# Patient Record
Sex: Male | Born: 1965 | Race: Black or African American | Hispanic: No | Marital: Married | Smoking: Never smoker
Health system: Southern US, Community
[De-identification: ages and names within clinical notes are randomized; demographics above are authoritative.]

## PROBLEM LIST (undated history)

## (undated) DIAGNOSIS — Z9189 Other specified personal risk factors, not elsewhere classified: Secondary | ICD-10-CM

## (undated) DIAGNOSIS — R030 Elevated blood-pressure reading, without diagnosis of hypertension: Secondary | ICD-10-CM

## (undated) DIAGNOSIS — E785 Hyperlipidemia, unspecified: Secondary | ICD-10-CM

## (undated) DIAGNOSIS — Z9889 Other specified postprocedural states: Secondary | ICD-10-CM

## (undated) HISTORY — PX: ROTATOR CUFF REPAIR: SHX139

## (undated) HISTORY — DX: Other specified postprocedural states: Z98.89

## (undated) HISTORY — PX: OTHER SURGICAL HISTORY: SHX169

## (undated) HISTORY — DX: Other specified personal risk factors, not elsewhere classified: Z91.89

## (undated) HISTORY — DX: Elevated blood-pressure reading, without diagnosis of hypertension: R03.0

## (undated) HISTORY — DX: Hyperlipidemia, unspecified: E78.5

---

## 2006-07-09 ENCOUNTER — Ambulatory Visit: Payer: Self-pay | Admitting: Internal Medicine

## 2006-07-09 LAB — CONVERTED CEMR LAB
Albumin: 3.9 g/dL (ref 3.5–5.2)
Alkaline Phosphatase: 54 units/L (ref 39–117)
CO2: 31 meq/L (ref 19–32)
Glucose, Bld: 111 mg/dL — ABNORMAL HIGH (ref 70–99)
Total Bilirubin: 1.1 mg/dL (ref 0.3–1.2)
Total CHOL/HDL Ratio: 6.5
Total Protein: 7.3 g/dL (ref 6.0–8.3)
Triglycerides: 231 mg/dL (ref 0–149)

## 2006-07-25 ENCOUNTER — Ambulatory Visit: Payer: Self-pay | Admitting: Internal Medicine

## 2006-12-06 ENCOUNTER — Emergency Department (HOSPITAL_COMMUNITY): Admission: EM | Admit: 2006-12-06 | Discharge: 2006-12-06 | Payer: Self-pay | Admitting: Emergency Medicine

## 2007-01-01 ENCOUNTER — Emergency Department (HOSPITAL_COMMUNITY): Admission: EM | Admit: 2007-01-01 | Discharge: 2007-01-01 | Payer: Self-pay | Admitting: Emergency Medicine

## 2007-04-27 ENCOUNTER — Encounter: Payer: Self-pay | Admitting: *Deleted

## 2007-04-27 DIAGNOSIS — Z9889 Other specified postprocedural states: Secondary | ICD-10-CM

## 2007-04-27 DIAGNOSIS — Z9189 Other specified personal risk factors, not elsewhere classified: Secondary | ICD-10-CM

## 2007-04-27 DIAGNOSIS — E785 Hyperlipidemia, unspecified: Secondary | ICD-10-CM

## 2007-04-27 HISTORY — DX: Hyperlipidemia, unspecified: E78.5

## 2007-04-27 HISTORY — DX: Other specified postprocedural states: Z98.89

## 2007-04-27 HISTORY — DX: Other specified personal risk factors, not elsewhere classified: Z91.89

## 2007-10-04 ENCOUNTER — Emergency Department (HOSPITAL_COMMUNITY): Admission: EM | Admit: 2007-10-04 | Discharge: 2007-10-04 | Payer: Self-pay | Admitting: Family Medicine

## 2008-01-07 ENCOUNTER — Ambulatory Visit: Payer: Self-pay | Admitting: Internal Medicine

## 2008-01-07 DIAGNOSIS — I10 Essential (primary) hypertension: Secondary | ICD-10-CM

## 2008-01-07 DIAGNOSIS — R030 Elevated blood-pressure reading, without diagnosis of hypertension: Secondary | ICD-10-CM

## 2008-01-07 HISTORY — DX: Elevated blood-pressure reading, without diagnosis of hypertension: R03.0

## 2010-08-12 NOTE — Assessment & Plan Note (Signed)
[facility]                           PRIMARY CARE OFFICE NOTE   NAME:HARRISPromise, Bushong                      MRN:          [id number]  DATE:07/09/2006                            DOB:          [birth year]    Mr. Werber is a 45 year old African-American gentleman who presents to  establish for ongoing continuity of care.  He has no active complaints  at this time, and reports he is feeling well and doing well.   PAST MEDICAL HISTORY:   SURGICAL:  1. Arthroscopic repair of a right rotator cuff tear in 1987.  2. Arthroscopic repair of a torn ACL on the left in 1996.   MEDICAL ILLNESSES:  The patient had chicken pox as a child, and  otherwise fully immunized.  He has no other major medical illness and  has been healthy.   CURRENT MEDICATIONS:  None.   No drug allergies.   HABITS:  Tobacco, none.  Alcohol, none.  No recreational drug use.   FAMILY HISTORY:  Father had hyperlipidemia and hypertension.  Mother has  diabetes, on oral medication.  No family history for colon cancer, lung  cancer, prostate cancer or heart disease.   SOCIAL HISTORY:  The patient completed several years of college.  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  Patient was married for 5 years, divorced, has been married 6 months to  his current wife, but he has been in a relationship with her for several  years.  Patient has 2 daughters, age 29, in college, and a daughter, 66  years old, who lives with her mother.  Patient was in the [employer] for 6  years and mustered out E-5.  Reports his marriage is in good health.  He  does have a stepson who I will be seeing later today.   REVIEW OF SYSTEMS:  The patient has had no constitutional problems  except for mild weight gain.  No ophthalmologic, ENT, cardiovascular,  respiratory, GI, or GU problems, except he does a report a recent  episode of significant nocturia which was self limited and resolved  in  several days.  No musculoskeletal, dermatologic, neurologic, or  psychiatric issues.   PHYSICAL EXAMINATION:  Temperature was 97.2, blood pressure 156/94,  pulse 72, weight 232.  GENERAL APPEARANCE:  This is a very muscular-appearing gentleman in no  acute distress.  HEENT EXAM:  Normocephalic, atraumatic.  EACs and TMs were normal.  Oropharynx with native dentition in fair repair, although he is missing  several teeth.  No gingivitis was noted.  Posterior pharynx was clear.  Conjunctivae and sclerae were clear.  PERRLA, EOMI.  Funduscopic exam  was unremarkable.  NECK:  Supple without thyromegaly.  NODES:  No lymphadenopathy was noted in the cervical, supraclavicular  regions.  CHEST:  No CVA tenderness.  LUNGS:  Clear with no rales, wheezes, or rhonchi.  CARDIOVASCULAR:  2+ radial pulses, no JVD or carotid bruits.  He had a  quiet precordium with a regular rate and rhythm without murmurs, rubs,  or gallops.  ABDOMEN:  Soft, no guarding,  no rebound.  No organosplenomegaly was  appreciated.  GENITALIA:  Bilaterally descended testicles without masses, normal  phallus.  RECTAL EXAM:  Normal sphincter tone was noted.  Prostate is smooth and  round, normal in size and contour without nodules.  EXTREMITIES:  Without clubbing, cyanosis or edema or deformity.  NEUROLOGIC EXAM:  Nonfocal.   LABORATORY DATA:  Chemistries were normal with a blood sugar of 111,  kidney function was normal with a creatinine of 1.0 and GFR of 105  milliliters per minute.  Liver functions were normal.  Lipid profile  revealed a total cholesterol of 271, triglycerides 231, HDL was 41.9,  LDL cholesterol was 194.4.   ASSESSMENT AND PLAN:  1. Hyperlipidemia.  Patient with markedly elevated LDL cholesterol and      total cholesterol.  He will be notified by phone tree and be asked      to come see me to discuss therapy for this.  He is a candidate for      medication given his markedly elevated LDL  cholesterol.  2. Health maintenance.  Patient's examination is otherwise      unremarkable.  Laboratories are otherwise normal.   In summary, he is a very healthy-appearing gentleman who is newly  diagnosed with hyperlipidemia, will return for discussion of medical  options.     Rosalyn Gess Norins, MD  Electronically Signed    MEN/MedQ  DD: 07/09/2006  DT: 07/10/2006  Job #: [id number]   cc:   Wyn Forster

## 2010-11-11 ENCOUNTER — Ambulatory Visit: Payer: Self-pay | Admitting: Internal Medicine

## 2010-11-16 ENCOUNTER — Ambulatory Visit (INDEPENDENT_AMBULATORY_CARE_PROVIDER_SITE_OTHER)
Admission: RE | Admit: 2010-11-16 | Discharge: 2010-11-16 | Disposition: A | Discharge status: Home / Self Care | Payer: 59 | Source: Ambulatory Visit | Attending: Internal Medicine | Admitting: Internal Medicine

## 2010-11-16 ENCOUNTER — Other Ambulatory Visit (INDEPENDENT_AMBULATORY_CARE_PROVIDER_SITE_OTHER): Payer: 59

## 2010-11-16 ENCOUNTER — Other Ambulatory Visit: Payer: Self-pay | Admitting: Internal Medicine

## 2010-11-16 ENCOUNTER — Ambulatory Visit (INDEPENDENT_AMBULATORY_CARE_PROVIDER_SITE_OTHER): Payer: 59 | Admitting: Internal Medicine

## 2010-11-16 VITALS — BP 140/100 | HR 84 | Temp 98.3°F | Wt 231.0 lb

## 2010-11-16 DIAGNOSIS — R030 Elevated blood-pressure reading, without diagnosis of hypertension: Secondary | ICD-10-CM

## 2010-11-16 DIAGNOSIS — Z0000 Encounter for general adult medical examination without abnormal findings: Secondary | ICD-10-CM

## 2010-11-16 DIAGNOSIS — R7989 Other specified abnormal findings of blood chemistry: Secondary | ICD-10-CM

## 2010-11-16 DIAGNOSIS — E785 Hyperlipidemia, unspecified: Secondary | ICD-10-CM

## 2010-11-16 DIAGNOSIS — Z136 Encounter for screening for cardiovascular disorders: Secondary | ICD-10-CM

## 2010-11-16 LAB — COMPREHENSIVE METABOLIC PANEL
BUN: 11 mg/dL (ref 6–23)
CO2: 30 mEq/L (ref 19–32)
Calcium: 9.6 mg/dL (ref 8.4–10.5)
Chloride: 102 mEq/L (ref 96–112)
Creatinine, Ser: 1.2 mg/dL (ref 0.4–1.5)
GFR: 85.06 mL/min (ref >60.00)
Glucose, Bld: 125 mg/dL — ABNORMAL HIGH (ref 70–99)

## 2010-11-16 LAB — LIPID PANEL
HDL: 43.7 mg/dL (ref >39.00)
Triglycerides: 309 mg/dL — ABNORMAL HIGH (ref 0.0–149.0)

## 2010-11-16 MED ORDER — HYDROCHLOROTHIAZIDE 25 MG PO TABS: 25.0000 mg | ORAL_TABLET | Freq: Every day | ORAL | Status: DC

## 2010-11-17 ENCOUNTER — Telehealth: Payer: Self-pay | Admitting: Internal Medicine

## 2010-11-17 ENCOUNTER — Encounter: Payer: Self-pay | Admitting: Internal Medicine

## 2010-11-17 DIAGNOSIS — Z0000 Encounter for general adult medical examination without abnormal findings: Secondary | ICD-10-CM | POA: Insufficient documentation

## 2010-11-17 MED ORDER — ATORVASTATIN CALCIUM 20 MG PO TABS: 20.0000 mg | ORAL_TABLET | Freq: Every day | ORAL | Status: DC

## 2010-11-17 NOTE — Assessment & Plan Note (Signed)
BP Readings from Last 3 Encounters:  11/16/10 140/100  01/07/08 154/96  07/25/06 150/91   Definite elevation in blood pressure over time. Initial evaluation with lab, cxr and EKG with no signs of end-organ damage.  Plan - diuretic therapy with HCTZ 25 mg daily           Follow-up BP

## 2010-11-17 NOTE — Telephone Encounter (Signed)
Patient informed. 

## 2010-11-17 NOTE — Telephone Encounter (Signed)
pleaswe call patient - LDL 192. Rx for lipitor sent to [facility]. Return for lab in 1 month Thanks

## 2010-11-17 NOTE — Progress Notes (Signed)
Subjective:    Patient ID: Jeremy Velasquez, male    DOB: 11-Jul-1965, 45 y.o.   MRN: [id number]  HPI Mr. Jeremy Velasquez presents for a general medial exam. He was last seen in '09. Labs from that visit reviewed revealing LDL 194. He was also hypertensive at that visit. He chose life-style management and no medications were initiated. He reports that he has had Photographer physical exam by the county and was told his BP was OK and his lipids were a little high but he was not advised to seek treatment.   He has been feeling well. He is very athletic working out on a regular basis. He does admit to some weight gain.          Review of Systems Review of Systems  Constitutional:  Negative for fever, chills, activity change and unexpected weight change.  HEENT:  Negative for hearing loss, ear pain, congestion, neck stiffness and postnasal drip. Negative for sore throat or swallowing problems. Negative for dental complaints.   Eyes: Negative for vision loss or change in visual acuity. Copper wiring noted on fundoscopic exam Respiratory: Negative for chest tightness and wheezing.   Cardiovascular: Negative for chest pain and palpitation. No decreased exercise tolerance Gastrointestinal: No change in bowel habit. No bloating or gas. No reflux or indigestion Genitourinary: Negative for urgency, frequency, flank pain and difficulty urinating.  Musculoskeletal: Negative for myalgias, back pain, arthralgias and gait problem.  Neurological: Negative for dizziness, tremors, weakness and headaches.  Hematological: Negative for adenopathy.  Psychiatric/Behavioral: Negative for behavioral problems and dysphoric mood.       Objective:   Physical Exam Vital signs reviewed - BP elevated Gen'l: Well nourished well developed AA male in no acute distress  HEENT:  Head: Normocephalic and atraumatic.  Right Ear: External ear normal. EAC/TM nl Left Ear: External ear normal.  EAC/TM nl Nose: Nose  normal.  Mouth/Throat: Oropharynx is clear and moist. Dentition - native, in good repair. No buccal or palatal lesions. Posterior pharynx clear. Eyes: Conjunctivae and sclera clear. EOM intact. Pupils are equal, round, and reactive to light. Fundiscopic exam reveals copper wiring. Right eye exhibits no discharge. Left eye exhibits no discharge. Neck: Normal range of motion. Neck supple. No JVD present. No tracheal deviation present. No thyromegaly present.  Cardiovascular: Normal rate, regular rhythm, no gallop, no friction rub, no murmur heard.      Quiet precordium. 2+ radial and DP pulses . No carotid bruits Pulmonary/Chest: Effort normal. No respiratory distress or increased WOB, no wheezes, no rales. No chest wall deformity or CVAT. Abdominal: Soft. Bowel sounds are normal in all quadrants. He exhibits no distension, no tenderness, no rebound or guarding, No heptosplenomegaly  Genitourinary:  deferred Musculoskeletal: Normal range of motion. He exhibits no edema and no tenderness.       Small and large joints without redness, synovial thickening or deformity. Full range of motion preserved about all small, median and large joints.  Lymphadenopathy:    He has no cervical or supraclavicular adenopathy.  Neurological: He is alert and oriented to person, place, and time. CN II-XII intact. DTRs 2+ and symmetrical biceps, radial and patellar tendons. Cerebellar function normal with no tremor, rigidity, normal gait and station.  Skin: Skin is warm and dry. No rash noted. No erythema.  Psychiatric: He has a normal mood and affect. His behavior is normal. Thought content normal.   Lab Results  Component Value Date   CHOL 286* 11/16/2010   TRIG 309.0*  11/16/2010   HDL 43.70 11/16/2010   LDLDIRECT 192.3 11/16/2010   ALT 30 11/16/2010   AST 24 11/16/2010   NA 138 11/16/2010   K 4.1 11/16/2010   CL 102 11/16/2010   CREATININE 1.2 11/16/2010   BUN 11 11/16/2010   CO2 30 11/16/2010       Glucose                  125 (non-fasting)                                             11/16/2010  Clinical Data: Hypertension  CHEST - 2 VIEW  Comparison: None  Findings: Heart size and mediastinal contours appear normal.  No pleural effusion or pulmonary edema identified.  No airspace consolidation identified.  Review of the visualized osseous structures is significant for mild  multilevel thoracic spondylosis.  IMPRESSION:  1. No active cardiopulmonary abnormalities.  Original Report Authenticated By: Rosealee Albee, M.D.  12 lead EKG normal without signs of LVH         Assessment & Plan:

## 2010-11-17 NOTE — Assessment & Plan Note (Signed)
Patinet had an LDL of 194 in '09. Repeat lab today reveals LDL 192. By NCEP ATP III guidelines he needs medical treatment.  Plan - atorvastatin 20 mg daily           Follow-up lab in 4 weeks with recommendations to follow.

## 2010-12-22 LAB — POCT RAPID STREP A: Streptococcus, Group A Screen (Direct): NEGATIVE

## 2012-08-20 ENCOUNTER — Other Ambulatory Visit: Payer: Self-pay | Admitting: Orthopedic Surgery

## 2012-08-20 DIAGNOSIS — M542 Cervicalgia: Secondary | ICD-10-CM

## 2012-08-21 ENCOUNTER — Other Ambulatory Visit: Payer: 59

## 2012-09-28 ENCOUNTER — Ambulatory Visit (INDEPENDENT_AMBULATORY_CARE_PROVIDER_SITE_OTHER): Payer: 59 | Admitting: Family Medicine

## 2012-09-28 VITALS — BP 128/64 | HR 99 | Temp 98.2°F | Resp 16 | Ht 69.0 in | Wt 228.0 lb

## 2012-09-28 DIAGNOSIS — R059 Cough, unspecified: Secondary | ICD-10-CM

## 2012-09-28 DIAGNOSIS — J209 Acute bronchitis, unspecified: Secondary | ICD-10-CM

## 2012-09-28 DIAGNOSIS — J00 Acute nasopharyngitis [common cold]: Secondary | ICD-10-CM

## 2012-09-28 DIAGNOSIS — R05 Cough: Secondary | ICD-10-CM

## 2012-09-28 MED ORDER — AZITHROMYCIN 250 MG PO TABS: ORAL_TABLET | ORAL | Status: DC

## 2012-09-28 MED ORDER — BENZONATATE 100 MG PO CAPS: 200.0000 mg | ORAL_CAPSULE | Freq: Two times a day (BID) | ORAL | Status: DC | PRN

## 2012-09-28 MED ORDER — FLUTICASONE PROPIONATE 50 MCG/ACT NA SUSP: 2.0000 | Freq: Every day | NASAL | Status: DC

## 2012-09-28 NOTE — Progress Notes (Signed)
[facility]:  Office Visit  Chief Complaint:  Chief Complaint  Patient presents with  . Cough    x 1 week     HPI: Jeremy Velasquez is a 48 y.o. male who complains of  1 week history of coughing and of feeling poorly, nonproductive cough. Mostly during the day. [place]. He has  Had subjective fevers, chills.  No SOB/wheezing. Has HA from coughing top of his head. He has tried nyquil, robitussion, mucinex without relief. Nonsmoker. No asthma/allergies. Denies facial pain. Denies ear pain.   Past Medical History  Diagnosis Date  . CHICKENPOX, HX OF 04/27/2007  . ELEVATED BLOOD PRESSURE WITHOUT DIAGNOSIS OF HYPERTENSION 01/07/2008  . HYPERLIPIDEMIA 04/27/2007  . ROTATOR CUFF REPAIR, RIGHT, HX OF 04/27/2007   Past Surgical History  Procedure Laterality Date  . Arthroscopy knee surgery      Left  . Rotator cuff repair      right   History   Social History  . Marital Status: Married    Spouse Name: N/A    Number of Children: N/A  . Years of Education: N/A   Social History Main Topics  . Smoking status: Never Smoker   . Smokeless tobacco: None  . Alcohol Use: No  . Drug Use: No  . Sexually Active:    Other Topics Concern  . None   Social History Narrative   HSG, Married "59- 6 months, divorced; married '95-5 years, divorced 07   2 daughters-'88, '86; 1 son '85; 2 step sons   [employer] 6 years, mustered out E-5   Work: Camera operator   Family History  Problem Relation Age of Onset  . Diabetes Mother   . Hyperlipidemia Father   . Hypertension Father    No Known Allergies Prior to Admission medications   Medication Sig Start Date End Date Taking? Authorizing Provider  Multiple Vitamin ([facility] DAILY MULTIPLE PO) Take 1 capsule by mouth.     Yes Historical Provider, MD  atorvastatin (LIPITOR) 20 MG tablet Take 1 tablet (20 mg total) by mouth daily. 11/17/10 11/17/11  Jacques Navy, MD  hydrochlorothiazide 25 MG  tablet Take 1 tablet (25 mg total) by mouth daily. 11/16/10 11/16/11  Jacques Navy, MD     ROS: The patient denies night sweats, unintentional weight loss, chest pain, palpitations, wheezing, dyspnea on exertion, nausea, vomiting, abdominal pain, dysuria, hematuria, melena, numbness, or tingling.   All other systems have been reviewed and were otherwise negative with the exception of those mentioned in the HPI and as above.    PHYSICAL EXAM: Filed Vitals:   09/28/12 1310  BP: 128/64  Pulse: 99  Temp: 98.2 F (36.8 C)  Resp: 16   Filed Vitals:   09/28/12 1310  Height: 5\' 9"  (1.753 m)  Weight: 228 lb (103.42 kg)   Body mass index is 33.65 kg/(m^2).  General: Alert, no acute distress HEENT:  Normocephalic, atraumatic, oropharynx patent. Tm nl. EOMI PERRLA, no exudates, + boggy erythematous nares. No sinus tenderness Cardiovascular:  Regular rate and rhythm, no rubs murmurs or gallops.  No Carotid bruits, radial pulse intact. No pedal edema.  Respiratory: Clear to auscultation bilaterally.  No wheezes, rales, or rhonchi.  No cyanosis, no use of accessory musculature GI: No organomegaly, abdomen is soft and non-tender, positive bowel sounds.  No masses. Skin: No rashes. Neurologic: Facial musculature symmetric. Psychiatric: Patient is appropriate throughout our interaction. Lymphatic: No cervical lymphadenopathy  Musculoskeletal: Gait intact.   LABS: Results for orders placed in visit on 11/16/10  LDL CHOLESTEROL, DIRECT      Result Value Range   Direct LDL 192.3       EKG/XRAY:   Primary read interpreted by Dr. Conley Rolls at [facility].   ASSESSMENT/PLAN: Encounter Diagnoses  Name Primary?  . Cough Yes  . Acute bronchitis   . Acute rhinitis     Rx Flonase, tessalon perles Rx Z pack  OTC antihistaime if no improvement with sxs treatment then can take z pack F/u prn    Tazia Illescas PHUONG, DO 09/28/2012 1:58 PM

## 2012-09-28 NOTE — Patient Instructions (Signed)

## 2013-03-03 ENCOUNTER — Ambulatory Visit (INDEPENDENT_AMBULATORY_CARE_PROVIDER_SITE_OTHER): Payer: 59 | Admitting: Internal Medicine

## 2013-03-03 ENCOUNTER — Other Ambulatory Visit (INDEPENDENT_AMBULATORY_CARE_PROVIDER_SITE_OTHER): Payer: 59

## 2013-03-03 ENCOUNTER — Encounter: Payer: Self-pay | Admitting: Internal Medicine

## 2013-03-03 VITALS — BP 168/118 | HR 75 | Temp 98.3°F | Ht 69.0 in | Wt 236.0 lb

## 2013-03-03 DIAGNOSIS — Z0000 Encounter for general adult medical examination without abnormal findings: Secondary | ICD-10-CM

## 2013-03-03 DIAGNOSIS — R739 Hyperglycemia, unspecified: Secondary | ICD-10-CM

## 2013-03-03 DIAGNOSIS — R7309 Other abnormal glucose: Secondary | ICD-10-CM

## 2013-03-03 DIAGNOSIS — E119 Type 2 diabetes mellitus without complications: Secondary | ICD-10-CM | POA: Insufficient documentation

## 2013-03-03 DIAGNOSIS — E785 Hyperlipidemia, unspecified: Secondary | ICD-10-CM

## 2013-03-03 DIAGNOSIS — Z23 Encounter for immunization: Secondary | ICD-10-CM

## 2013-03-03 DIAGNOSIS — R030 Elevated blood-pressure reading, without diagnosis of hypertension: Secondary | ICD-10-CM

## 2013-03-03 LAB — COMPREHENSIVE METABOLIC PANEL
ALT: 35 U/L (ref 0–53)
Albumin: 4.2 g/dL (ref 3.5–5.2)
CO2: 29 mEq/L (ref 19–32)
Calcium: 9.6 mg/dL (ref 8.4–10.5)
Chloride: 103 mEq/L (ref 96–112)
GFR: 83.42 mL/min (ref >60.00)
Potassium: 4.2 mEq/L (ref 3.5–5.1)
Sodium: 140 mEq/L (ref 135–145)
Total Protein: 8 g/dL (ref 6.0–8.3)

## 2013-03-03 LAB — LDL CHOLESTEROL, DIRECT: Direct LDL: 209.1 mg/dL

## 2013-03-03 MED ORDER — LOSARTAN POTASSIUM 50 MG PO TABS: 50.0000 mg | ORAL_TABLET | Freq: Every day | ORAL | Status: AC

## 2013-03-03 MED ORDER — ATORVASTATIN CALCIUM 20 MG PO TABS: 20.0000 mg | ORAL_TABLET | Freq: Every day | ORAL | Status: DC

## 2013-03-03 NOTE — Progress Notes (Signed)
Pre visit review using our clinic review tool, if applicable. No additional management support is needed unless otherwise documented below in the visit note. 

## 2013-03-03 NOTE — Progress Notes (Signed)
Subjective:     Patient ID: Jeremy Velasquez, male   DOB: 1965-10-30, 47 y.o.   MRN: [id number]  HPI Jeremy Velasquez is a 47 yo male with PMH of hyperlipidemia and who presents for annual physical. He denies any new complaints at this time. He is feeling well, although he is concerned about his blood pressure. His bp today in the office was 168/118. He checked his blood pressure two weeks ago at [facility] and it was 140/90. He admits he feels somewhat anxious about coming to the doctor.  Interval history: Jeremy Velasquez received glasses in May of 2013 from optometrist.  He had a pinched nerve in his neck diagnosed in May 2014 by Dr. Venetia Maxon (neurosurgeon), which lead to tingling in R arm. He feels some discomfort in his R shoulder. He has had limited exercise due to his arm. He has not been able to ride his bike like he would like to. In January, he will able to get back to his normal activities.   He does not eat fast food regularly. He cooks regularly for himself. He eats vegetables and canned fruit. He avoids eating fresh fruit due to concern of pesticides. He recently started working at the 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 He says he doesn't feel stressed; although, believes he admits his jobs are stressful.   He never picked up his lipitor two years (2012) ago for high cholesterol that was prescribed by Dr. Debby Bud. At this time, he only takes a multivitamin.   Past Medical History  Diagnosis Date  . CHICKENPOX, HX OF 04/27/2007  . ELEVATED BLOOD PRESSURE WITHOUT DIAGNOSIS OF HYPERTENSION 01/07/2008  . HYPERLIPIDEMIA 04/27/2007  . ROTATOR CUFF REPAIR, RIGHT, HX OF 04/27/2007   Past Surgical History  Procedure Laterality Date  . Arthroscopy knee surgery      Left  . Rotator cuff repair      right   Family History  Problem Relation Age of Onset  . Diabetes Mother   . Hyperlipidemia Father   . Hypertension Father    History   Social History  . Marital  Status: Married    Spouse Name: N/A    Number of Children: N/A  . Years of Education: N/A   Occupational History  . Not on file.   Social History Main Topics  . Smoking status: Never Smoker   . Smokeless tobacco: Not on file  . Alcohol Use: No  . Drug Use: No  . Sexual Activity:    Other Topics Concern  . 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 Multiple Vitamin ([facility] DAILY MULTIPLE PO) Take 1 capsule by mouth.         No current facility-administered medications on file prior to visit.      Review of Systems System review is negative for any constitutional, cardiac, pulmonary, GI or neuro symptoms or complaints other than as described in the HPI.      Objective:   Physical Exam Gen: Well-appearing man in no acute distress HEENT: Antiicteric sclerae. PERRL. Normal tympanic membranes. Nares patent. Non-erythematous oropharynx. No cervical lymphadenopathy. No thyromegaly.  CV:  RRR. No murmurs, rubs or gallops. 2+ radial and posterior tibial pulses bilaterally. PMI nondisplaced.  Abd: Soft, non-tender to palpation, and non-distended. No hepatomegaly or splenomegaly. Bowel sounds present.  Nuero: AOx3. EOM intact without nystagmus. Facial strength and sensation intact bilaterally. Normal shoulder shrug strength. 5/5 grip strength bilaterally. 5/5 lower extremity strength bilaterally. Intact sensation to light touch and pinprick in upper and lower extremities.   Recent Results (from the past 2160 hour(s))  COMPREHENSIVE METABOLIC PANEL     Status: Abnormal   Collection Time    03/03/13 11:47 AM      Result Value Range   Sodium 140  135 - 145 mEq/L   Potassium 4.2  3.5 - 5.1 mEq/L    Chloride 103  96 - 112 mEq/L   CO2 29  19 - 32 mEq/L   Glucose, Bld 115 (*) 70 - 99 mg/dL   BUN 13  6 - 23 mg/dL   Creatinine, Ser 1.2  0.4 - 1.5 mg/dL   Total Bilirubin 1.1  0.3 - 1.2 mg/dL   Alkaline Phosphatase 59  39 - 117 U/L   AST 22  0 - 37 U/L   ALT 35  0 - 53 U/L   Total Protein 8.0  6.0 - 8.3 g/dL   Albumin 4.2  3.5 - 5.2 g/dL   Calcium 9.6  8.4 - 16.1 mg/dL   GFR 09.60  >45.40 mL/min  LIPID PANEL     Status: Abnormal   Collection Time    03/03/13 11:47 AM      Result Value Range   Cholesterol 288 (*) 0 - 200 mg/dL   Comment: ATP III Classification       Desirable:  < 200 mg/dL               Borderline High:  200 - 239 mg/dL          High:  > = 981 mg/dL   Triglycerides 191.4 (*) 0.0 - 149.0 mg/dL   Comment: Normal:  <782 mg/dLBorderline High:  150 - 199 mg/dL   HDL 95.62 (*) >13.08 mg/dL   VLDL 65.7 (*) 0.0 - 84.6 mg/dL   Total CHOL/HDL Ratio 8     Comment:                Men          Women1/2 Average Risk     3.4          3.3Average Risk          5.0          4.42X Average Risk          9.6          7.13X Average Risk          15.0          11.0                      HEMOGLOBIN A1C     Status: Abnormal   Collection Time    03/03/13 11:47 AM      Result Value Range   Hemoglobin A1C 7.1 (*) 4.6 - 6.5 %   Comment: Glycemic Control Guidelines for People with Diabetes:Non Diabetic:  <6%Goal of Therapy: <7%Additional Action Suggested:  >8%   LDL CHOLESTEROL, DIRECT     Status: None   Collection Time    03/03/13 11:47 AM      Result Value Range   Direct LDL  209.1     Comment: Optimal:  <100 mg/dLNear or Above Optimal:  100-129 mg/dLBorderline High:  130-159 mg/dLHigh:  160-189 mg/dLVery High:  >190 mg/dL       Assessment and Plan:     Jeremy Velasquez is a 47 yo male with hypercholesterolemia who present today for an annual physical.   1) Order CBC and lipid panel to check cholesterol, lipid levels and renal function. Order A1c to check glucose since past labs show  borderline glucose levels.  - Consider starting Lipitor (atorvastatin) if cholesterol is elevated. If start the drug, follow up in one month to check liver and cholesterol panel.  2) Blood pressure: His blood pressure was elevated (168/118) in the office today. He was started on losartan today.   3) He received a flu shot today.   4) Joint Pain: Advised him about exercises and glucosamine to help with pain in his joints.

## 2013-03-03 NOTE — Patient Instructions (Signed)
1) Cholesterol: We took labs today to check your cholesterol. Your cholesterol in the past (2012) was elevated.  If your cholesterol is elevated on your current labs, we will probably start a medication (lipitor) to lower your cholesterol. If you are started on lipitor, we will want to check your cholesterol again in 1 month after initiation of the drug.   2) Blood sugar: We took a lab today to check your blood sugar. We will let you know the results of these tests.   3) Blood pressure: Your blood pressure was elevated at the office today (168/118). You were started on losartan (angiotensin receptor blocker) to help with your blood pressure. Please try to check your blood pressure at home or at the fire station.  4) Joint Pain: Range of motion exercises will help to improve your joint pain. Additionally, weight loss will help your joints. You can take Tylenol as needed for joint pain. You can consider starting glucosamine to help with your joints. You will need to take glucosamine for 6 - 8 weeks to see if you have any improvement. Unfortunately, we can't comment on the quality of the vitamins you are taken.   5) Please send [place] the records from Dr. Venetia Maxon (MRI results and notes) and your vaccination records (Tdap) so we can be aware of them.

## 2013-03-04 ENCOUNTER — Telehealth: Payer: Self-pay

## 2013-03-04 NOTE — Telephone Encounter (Signed)
Message copied by Noreene Larsson on Tue Mar 04, 2013  8:26 AM ------      Message from: Jacques Navy      Created: Tue Mar 04, 2013  4:03 AM       Please call: come to lab Jan 12th. Needs office visit 2 days later.      Thanks ------

## 2013-03-04 NOTE — Assessment & Plan Note (Signed)
INterval history - no major illness, injury or surgery but untreated hypertension and hyperlipidemia. Physical exam is normal. Lab reviewed: abnormal serum glucose, A1C, lipids. He is current with immunizations by his report.  In summary   A nice man who is now clearly informed of his medical problems. He will return in 1 month for lab and f/u OV.

## 2013-03-04 NOTE — Telephone Encounter (Signed)
Pt is aware.  Scheduled for Monday morning.

## 2013-03-04 NOTE — Assessment & Plan Note (Signed)
H/o elevated serum glucose. A1C today at 7.1%  Plan Patient to return in one month for follow up. Will review importance of lifestyle management. May need referral to DM educator/dietician.

## 2013-03-04 NOTE — Telephone Encounter (Signed)
Left message on mobile phone that he needs to come to lab on Jan 12 and then have an appt on Jan 14. I asked him when he gets this message to call the office.  Harriett Sine will you put patient on the schedule for Jan 14 just so he's on the schedule, thanks

## 2013-03-04 NOTE — Assessment & Plan Note (Signed)
BP Readings from Last 3 Encounters:  03/03/13 168/118  09/28/12 128/64  11/16/10 140/100   Patient with history c/w hypertension - untreated.  Plan Start ARB  ROV BP check and lab in 1 month.

## 2013-03-04 NOTE — Assessment & Plan Note (Signed)
Patient does not recall in '12 from the office about his LDL being 192 and the need to start medication. This was reviewed today. Repeat lab with LDL 206.  Plan Start lipitor 20 mg  Follow up lab and ov in 1 month.

## 2013-03-10 ENCOUNTER — Encounter: Payer: Self-pay | Admitting: Internal Medicine

## 2013-03-10 ENCOUNTER — Ambulatory Visit (INDEPENDENT_AMBULATORY_CARE_PROVIDER_SITE_OTHER): Payer: 59 | Admitting: Internal Medicine

## 2013-03-10 VITALS — BP 158/100 | HR 87 | Temp 97.2°F | Wt 237.8 lb

## 2013-03-10 DIAGNOSIS — E119 Type 2 diabetes mellitus without complications: Secondary | ICD-10-CM

## 2013-03-10 DIAGNOSIS — I10 Essential (primary) hypertension: Secondary | ICD-10-CM

## 2013-03-10 DIAGNOSIS — E785 Hyperlipidemia, unspecified: Secondary | ICD-10-CM

## 2013-03-10 NOTE — Assessment & Plan Note (Signed)
Jeremy Velasquez' last A1c was 7.1 confirming the diagnosis of diabetes.   Plan Advised patient to continue with his low carb, low fat diet and with his exercise program.   Patient did not desire to meet with a diabetic educator at this time, but told him he can meet with one whenever he desires.   Recheck A1c in 3 months to see how effective his lifestyle interventions are.

## 2013-03-10 NOTE — Assessment & Plan Note (Signed)
Blood pressure was elevated again today (158/100). His BP last week was 168/118. Patient does not want to start losartan. Advised patient on the benefits of losartan: reduced risk of heart attacks, stroke and protection from diabetic damage to the kidneys. Patient is willing to start hydrochlorothiazide.   Plan  Asked patient to send message to Dr. Debby Bud regarding what medication he wishes to start.

## 2013-03-10 NOTE — Progress Notes (Signed)
Subjective:     Patient ID: Jeremy Velasquez, male   DOB: [birth year], 47 y.o.   MRN: [id number]  HPI Jeremy Velasquez is a 47 yo male with PMH of hyperlipidemia and recently diagnosed diabetes, who presents for follow up on his lab work and diagnosis of diabetes.   He started working out today. He plans to work out 4 -5 times a week doing weightlifting and Education administrator. He was waiting for his L shoulder to heal, but decided to go ahead and begin exercising. He is being careful about protecting his shoulder. He will be cleared to be normal duty with regards to his shoulder in January.  He has changed his diet. He cut out all fast food and fried food. He is eating more vegetables, fruits and nuts. He does not drink soda or sweet tea. He drinks water and juice. He drinks one glass of sunny-d a day. He is regularly drinking water. He has gained about 25 lbs over the past year.   He is familiar with diabetes because his mother in law has diabetes. He does not care to see a diabetic counselor at this time. He wants to make several lifestyle changes (which he has already initiated) to help control his diabetes and lipids.  He is not currently taking the losartan and Lipitor. He does not want to start taking the losartan. He would prefer to start taking hydrochlorothiazide. He is not ready to start taking Lipitor. He wants to avoid taking medications if at all possible.   PMH, FamHx and SocHx reviewed for any changes and relevance. Current Outpatient Prescriptions on File Prior to Visit  Medication Sig Dispense Refill  . atorvastatin (LIPITOR) 20 MG tablet Take 1 tablet (20 mg total) by mouth daily.  30 tablet  11  . losartan (COZAAR) 50 MG tablet Take 1 tablet (50 mg total) by mouth daily.  30 tablet  11  . Multiple Vitamin ([facility] DAILY MULTIPLE PO) Take 1 capsule by mouth.         No current facility-administered medications on file prior to visit.     Review of Systems Negative except as mentioned  above.      Objective:   Physical Exam Gen: Well-appearing man in no acute distress.  HEENT: Antiicteric Sclerae. PEERL. No cervical lymphadenopathy. CV:  RRR. No murmurs, rubs or gallops. 2+ Radial pulses. Abd: Soft, non-tender and non-distended.  Ext: No edema.     Assessment and Plan:    Jeremy Velasquez is a 47 yo male with PMH of hyperlipidemia and recently diagnosed diabetes, who presents for follow up on his lab work and diagnosis of diabetes.   1) Diabetes: Jeremy Velasquez' last A1c was 7.1 confirming the diagnosis of diabetes.  Advised patient to continue with his low carb, low fat diet and with his exercise program. Patient did not desire to meet with a diabetic educator at this time, but told him he can meet with one whenever he desires.  Recheck A1c in 3 months to see how effective his lifestyle interventions are.   2) HTN: Blood pressure was elevated again today (158/100). His BP last week was 168/118. Patient does not want to start losartan. Advised patient on the benefits of losartan: reduced risk of heart attacks, stroke and protection from diabetic damage to the kidneys. Patient is willing to start hydrochlorothiazide.  Asked patient to send message to Dr. Debby Bud regarding what medication he wishes to start.   3) Hypercholesteremia: His LDL cholesterol was 292.  Advised patient regarding the risks of high cholesterol. Patient has not been taking his statin and does not wish to start one at this time. Advised patient to continue with his diet and exercise.  Recheck BMP, chemistries and lipids in 3 months. Most likely will start a statin at that time if patient is willing to take a medication.

## 2013-03-10 NOTE — Progress Notes (Signed)
Pre visit review using our clinic review tool, if applicable. No additional management support is needed unless otherwise documented below in the visit note. 

## 2013-03-10 NOTE — Patient Instructions (Signed)
Pleasure seeing you again today Jeremy Velasquez! You are making awesome lifestyle changes.   1) Diabetes: You are making great strides in changing your diet and exercising regularly. Continue with these changes. Please continue to eat a low fat, low carb diet. If you stick with these changes, your blood sugars will likely remain well controlled.  If you desire to see a diabetic counselor/educator please let us know. We will recheck your hemoglobin A1c (long rang sugars) in 3 months to see if gets below 7. Your hemoglobin A1c was 7.1 last week.  2) Cholesterol: Your LDL cholesterol was 209 last week. We would like your LDL cholesterol less than 161. Continue with your diet changes. Changing your diet is the best way to lower your cholesterol. We can recheck your cholesterol in 3 months. We will reassess starting a statin at that time. Most likely, you will need to start a statin at that time.   3) Blood pressure: Your blood pressure today was 158/100, which was elevated. Losartan (angiotensin receptor blood pressure) lowers your blood pressure and protects your kidneys from damage that can occur to the kidneys from diabetes. Losartan reduces your risk of heart attacks and strokes. If your blood pressure becomes well controlled in the future, we can stop this medicaiton.   You can find web-sites online to learn about medications and their side effects. Send Dr. Debby Bud on [place] regarding your blood pressure. If you do not desire to start losartan, we can start hydrochlorthiazide; however, losartan would be a better choice for you.

## 2013-03-10 NOTE — Assessment & Plan Note (Signed)
LDL cholesterol was 202. Advised patient regarding the risks of high cholesterol. Patient has not been taking his statin and does not wish to start one at this time.   Plan Advised patient to continue with his diet and exercise.   Recheck BMP, chemistries and lipids in 3 months. Most likely will start a statin at that time if patient is willing to take a medication.

## 2013-05-16 ENCOUNTER — Emergency Department (HOSPITAL_COMMUNITY)
Admission: EM | Admit: 2013-05-16 | Discharge: 2013-05-16 | Disposition: A | Discharge status: Home / Self Care | Payer: Worker's Compensation | Attending: Emergency Medicine | Admitting: Emergency Medicine

## 2013-05-16 ENCOUNTER — Encounter (HOSPITAL_COMMUNITY): Payer: Self-pay | Admitting: Emergency Medicine

## 2013-05-16 ENCOUNTER — Emergency Department (HOSPITAL_COMMUNITY): Payer: Worker's Compensation

## 2013-05-16 DIAGNOSIS — Y990 Civilian activity done for income or pay: Secondary | ICD-10-CM | POA: Insufficient documentation

## 2013-05-16 DIAGNOSIS — W1809XA Striking against other object with subsequent fall, initial encounter: Secondary | ICD-10-CM | POA: Insufficient documentation

## 2013-05-16 DIAGNOSIS — Y929 Unspecified place or not applicable: Secondary | ICD-10-CM | POA: Insufficient documentation

## 2013-05-16 DIAGNOSIS — Z8619 Personal history of other infectious and parasitic diseases: Secondary | ICD-10-CM | POA: Insufficient documentation

## 2013-05-16 DIAGNOSIS — S63501A Unspecified sprain of right wrist, initial encounter: Secondary | ICD-10-CM

## 2013-05-16 DIAGNOSIS — Z862 Personal history of diseases of the blood and blood-forming organs and certain disorders involving the immune mechanism: Secondary | ICD-10-CM | POA: Insufficient documentation

## 2013-05-16 DIAGNOSIS — Y939 Activity, unspecified: Secondary | ICD-10-CM | POA: Insufficient documentation

## 2013-05-16 DIAGNOSIS — Z79899 Other long term (current) drug therapy: Secondary | ICD-10-CM | POA: Insufficient documentation

## 2013-05-16 DIAGNOSIS — S8010XA Contusion of unspecified lower leg, initial encounter: Secondary | ICD-10-CM | POA: Insufficient documentation

## 2013-05-16 DIAGNOSIS — S63509A Unspecified sprain of unspecified wrist, initial encounter: Secondary | ICD-10-CM | POA: Insufficient documentation

## 2013-05-16 DIAGNOSIS — Z8639 Personal history of other endocrine, nutritional and metabolic disease: Secondary | ICD-10-CM | POA: Insufficient documentation

## 2013-05-16 DIAGNOSIS — S8011XA Contusion of right lower leg, initial encounter: Secondary | ICD-10-CM

## 2013-05-16 MED ORDER — IBUPROFEN 600 MG PO TABS: 600.0000 mg | ORAL_TABLET | Freq: Four times a day (QID) | ORAL | Status: AC | PRN

## 2013-05-16 MED ORDER — TRAMADOL HCL 50 MG PO TABS: 50.0000 mg | ORAL_TABLET | Freq: Four times a day (QID) | ORAL | Status: AC | PRN

## 2013-05-16 NOTE — ED Notes (Signed)
Pt. slipped and fell on ice this evening while on duty [facility]( [place] [employer]) this evening . No LOC / ambulatory , alert and oriented /respirations unlabored . Pt. reports right wrist pain and right shin discomfort .

## 2013-05-16 NOTE — Discharge Instructions (Signed)
Contusion °A contusion is a deep bruise. Contusions are the result of an injury that caused bleeding under the skin. The contusion may turn blue, purple, or yellow. Minor injuries will give you a painless contusion, but more severe contusions may stay painful and swollen for a few weeks.  °CAUSES  °A contusion is usually caused by a blow, trauma, or direct force to an area of the body. °SYMPTOMS  °· Swelling and redness of the injured area. °· Bruising of the injured area. °· Tenderness and soreness of the injured area. °· Pain. °DIAGNOSIS  °The diagnosis can be made by taking a history and physical exam. An X-ray, CT scan, or MRI may be needed to determine if there were any associated injuries, such as fractures. °TREATMENT  °Specific treatment will depend on what area of the body was injured. In general, the best treatment for a contusion is resting, icing, elevating, and applying cold compresses to the injured area. Over-the-counter medicines may also be recommended for pain control. Ask your caregiver what the best treatment is for your contusion. °HOME CARE INSTRUCTIONS  °· Put ice on the injured area. °· Put ice in a plastic bag. °· Place a towel between your skin and the bag. °· Leave the ice on for 15-20 minutes, 03-04 times a day. °· Only take over-the-counter or prescription medicines for pain, discomfort, or fever as directed by your caregiver. Your caregiver may recommend avoiding anti-inflammatory medicines (aspirin, ibuprofen, and naproxen) for 48 hours because these medicines may increase bruising. °· Rest the injured area. °· If possible, elevate the injured area to reduce swelling. °SEEK IMMEDIATE MEDICAL CARE IF:  °· You have increased bruising or swelling. °· You have pain that is getting worse. °· Your swelling or pain is not relieved with medicines. °MAKE SURE YOU:  °· Understand these instructions. °· Will watch your condition. °· Will get help right away if you are not doing well or get  worse. °Document Released: 12/21/2004 Document Revised: 06/05/2011 Document Reviewed: 01/16/2011 °ExitCare® Patient Information ©2014 [employer]. ° °Cryotherapy °Cryotherapy means treatment with cold. Ice or gel packs can be used to reduce both pain and swelling. Ice is the most helpful within the first 24 to 48 hours after an injury or flareup from overusing a muscle or joint. Sprains, strains, spasms, burning pain, shooting pain, and aches can all be eased with ice. Ice can also be used when recovering from surgery. Ice is effective, has very few side effects, and is safe for most people to use. °PRECAUTIONS  °Ice is not a safe treatment option for people with: °· Raynaud's phenomenon. This is a condition affecting small blood vessels in the extremities. Exposure to cold may cause your problems to return. °· Cold hypersensitivity. There are many forms of cold hypersensitivity, including: °· Cold urticaria. Red, itchy hives appear on the skin when the tissues begin to warm after being iced. °· Cold erythema. This is a red, itchy rash caused by exposure to cold. °· Cold hemoglobinuria. Red blood cells break down when the tissues begin to warm after being iced. The hemoglobin that carry oxygen are passed into the urine because they cannot combine with blood proteins fast enough. °· Numbness or altered sensitivity in the area being iced. °If you have any of the following conditions, do not use ice until you have discussed cryotherapy with your caregiver: °· Heart conditions, such as arrhythmia, angina, or chronic heart disease. °· High blood pressure. °· Healing wounds or open skin   in the area being iced. °· Current infections. °· Rheumatoid arthritis. °· Poor circulation. °· Diabetes. °Ice slows the blood flow in the region it is applied. This is beneficial when trying to stop inflamed tissues from spreading irritating chemicals to surrounding tissues. However, if you expose your skin to cold temperatures for too  long or without the proper protection, you can damage your skin or nerves. Watch for signs of skin damage due to cold. °HOME CARE INSTRUCTIONS °Follow these tips to use ice and cold packs safely. °· Place a dry or damp towel between the ice and skin. A damp towel will cool the skin more quickly, so you may need to shorten the time that the ice is used. °· For a more rapid response, add gentle compression to the ice. °· Ice for no more than 10 to 20 minutes at a time. The bonier the area you are icing, the less time it will take to get the benefits of ice. °· Check your skin after 5 minutes to make sure there are no signs of a poor response to cold or skin damage. °· Rest 20 minutes or more in between uses. °· Once your skin is numb, you can end your treatment. You can test numbness by very lightly touching your skin. The touch should be so light that you do not see the skin dimple from the pressure of your fingertip. When using ice, most people will feel these normal sensations in this order: cold, burning, aching, and numbness. °· Do not use ice on someone who cannot communicate their responses to pain, such as small children or people with dementia. °HOW TO MAKE AN ICE PACK °Ice packs are the most common way to use ice therapy. Other methods include ice massage, ice baths, and cryo-sprays. Muscle creams that cause a cold, tingly feeling do not offer the same benefits that ice offers and should not be used as a substitute unless recommended by your caregiver. °To make an ice pack, do one of the following: °· Place crushed ice or a bag of frozen vegetables in a sealable plastic bag. Squeeze out the excess air. Place this bag inside another plastic bag. Slide the bag into a pillowcase or place a damp towel between your skin and the bag. °· Mix 3 parts water with 1 part rubbing alcohol. Freeze the mixture in a sealable plastic bag. When you remove the mixture from the freezer, it will be slushy. Squeeze out the excess  air. Place this bag inside another plastic bag. Slide the bag into a pillowcase or place a damp towel between your skin and the bag. °SEEK MEDICAL CARE IF: °· You develop white spots on your skin. This may give the skin a blotchy (mottled) appearance. °· Your skin turns blue or pale. °· Your skin becomes waxy or hard. °· Your swelling gets worse. °MAKE SURE YOU:  °· Understand these instructions. °· Will watch your condition. °· Will get help right away if you are not doing well or get worse. °Document Released: 11/07/2010 Document Revised: 06/05/2011 Document Reviewed: 11/07/2010 °ExitCare® Patient Information ©2014 [employer]. ° °

## 2013-05-16 NOTE — ED Notes (Signed)
Patient explains that he slipped and fell on ice. Landed on right wrist and struck right lower leg on underside of vehicle.

## 2013-05-16 NOTE — ED Provider Notes (Signed)
CSN: [id number]     Arrival date & time 05/16/13  0109 History   None    Chief Complaint  Patient presents with  . Fall     (Consider location/radiation/quality/duration/timing/severity/associated sxs/prior Treatment) HPI This patient is a very pleasant 48 year old man who presents with complaints of pain in the right lower leg and right wrist following a fall from standing. The patient slipped on ice, fell backward and landed against his right outstretched hand. His right lower leg hits the ground.  Patient denies headache, head trauma, loss of consciousness. He denies pain in any the region.  He describes his pain as mild/moderate. The pain in the right wrist is worse with movements of the right wrist, pain in the right leg is worse with weightbearing. However, the patient is able to bear weight and ambulate without difficulty. Past Medical History  Diagnosis Date  . CHICKENPOX, HX OF 04/27/2007  . ELEVATED BLOOD PRESSURE WITHOUT DIAGNOSIS OF HYPERTENSION 01/07/2008  . HYPERLIPIDEMIA 04/27/2007  . ROTATOR CUFF REPAIR, RIGHT, HX OF 04/27/2007   Past Surgical History  Procedure Laterality Date  . Arthroscopy knee surgery      Left  . Rotator cuff repair      right   Family History  Problem Relation Age of Onset  . Diabetes Mother   . Hyperlipidemia Father   . Hypertension Father    History  Substance Use Topics  . Smoking status: Never Smoker   . Smokeless tobacco: Not on file  . Alcohol Use: No    Review of Systems Ten point review of symptoms performed and is negative with the exception of symptoms noted above.     Allergies  Review of patient's allergies indicates no known allergies.  Home Medications   Current Outpatient Rx  Name  Route  Sig  Dispense  Refill  . losartan (COZAAR) 50 MG tablet   Oral   Take 1 tablet (50 mg total) by mouth daily.   30 tablet   11   . Multiple Vitamin (MULTIVITAMIN WITH MINERALS) TABS tablet   Oral   Take 1 tablet by mouth  daily.          BP 162/97  Pulse 97  Temp(Src) 98.1 F (36.7 C) (Oral)  Resp 18  SpO2 98% Physical Exam Gen: well developed and well nourished appearing Head: NCAT Eyes: PERL, EOMI Nose: no epistaixis or rhinorrhea Mouth/throat: mucosa is moist and pink Neck:no c spine ttp Lungs: CTA B, no wheezing, rhonchi or rales CV: RRR, no murmur, extremities appear well perfused.  Back: no midline ttp Skin: warm and dry Ext: normal to inspection, FROM both shoulders, elbows, wrists, hips, knees, ankles. ttp over the mid right lower leg. No deformity. NVI. no dependent edema Neuro: CN ii-xii grossly intact, no focal deficits Psyche; normal affect,  calm and cooperative.   ED Course  Procedures (including critical care time) Labs Review Labs Reviewed - No data to display Imaging Review Dg Wrist Complete Right  05/16/2013   CLINICAL DATA:  Fall.  EXAM: RIGHT WRIST - COMPLETE 3+ VIEW  COMPARISON:  None.  FINDINGS: There is no evidence of fracture or dislocation. There is no evidence of arthropathy or other focal bone abnormality. Soft tissues are unremarkable.  IMPRESSION: Negative.   Electronically Signed   By: Charlett NoseKevin  Dover M.D.   On: 05/16/2013 01:53   Dg Tibia/fibula Right  05/16/2013   CLINICAL DATA:  Fall.  EXAM: RIGHT TIBIA AND FIBULA - 2 VIEW  COMPARISON:  None.  FINDINGS: There is no evidence of fracture or other focal bone lesions. Soft tissues are unremarkable.  IMPRESSION: Negative.   Electronically Signed   By: Charlett Nose M.D.   On: 05/16/2013 01:56     MDM   DDX: contusion v. Fracture.   Xrays negative for fracture.   Patient stable for d/c with plan for symptomatic management.     Brandt Loosen, MD 05/16/13 [phone]

## 2013-09-30 ENCOUNTER — Telehealth: Payer: Self-pay | Admitting: *Deleted

## 2013-09-30 DIAGNOSIS — E119 Type 2 diabetes mellitus without complications: Secondary | ICD-10-CM

## 2013-09-30 NOTE — Telephone Encounter (Signed)
Left message on machine for patient to call and schedule an appointment to follow up with diabetes. A1c, bmet ordered [facility]

## 2014-09-03 IMAGING — CR DG WRIST COMPLETE 3+V*R*
4 series · 4 of 4 positions shown · non-contrast
Comparison: None.

CLINICAL DATA: Fall.

EXAM:
RIGHT WRIST - COMPLETE 3+ VIEW

[x wrist pa right]
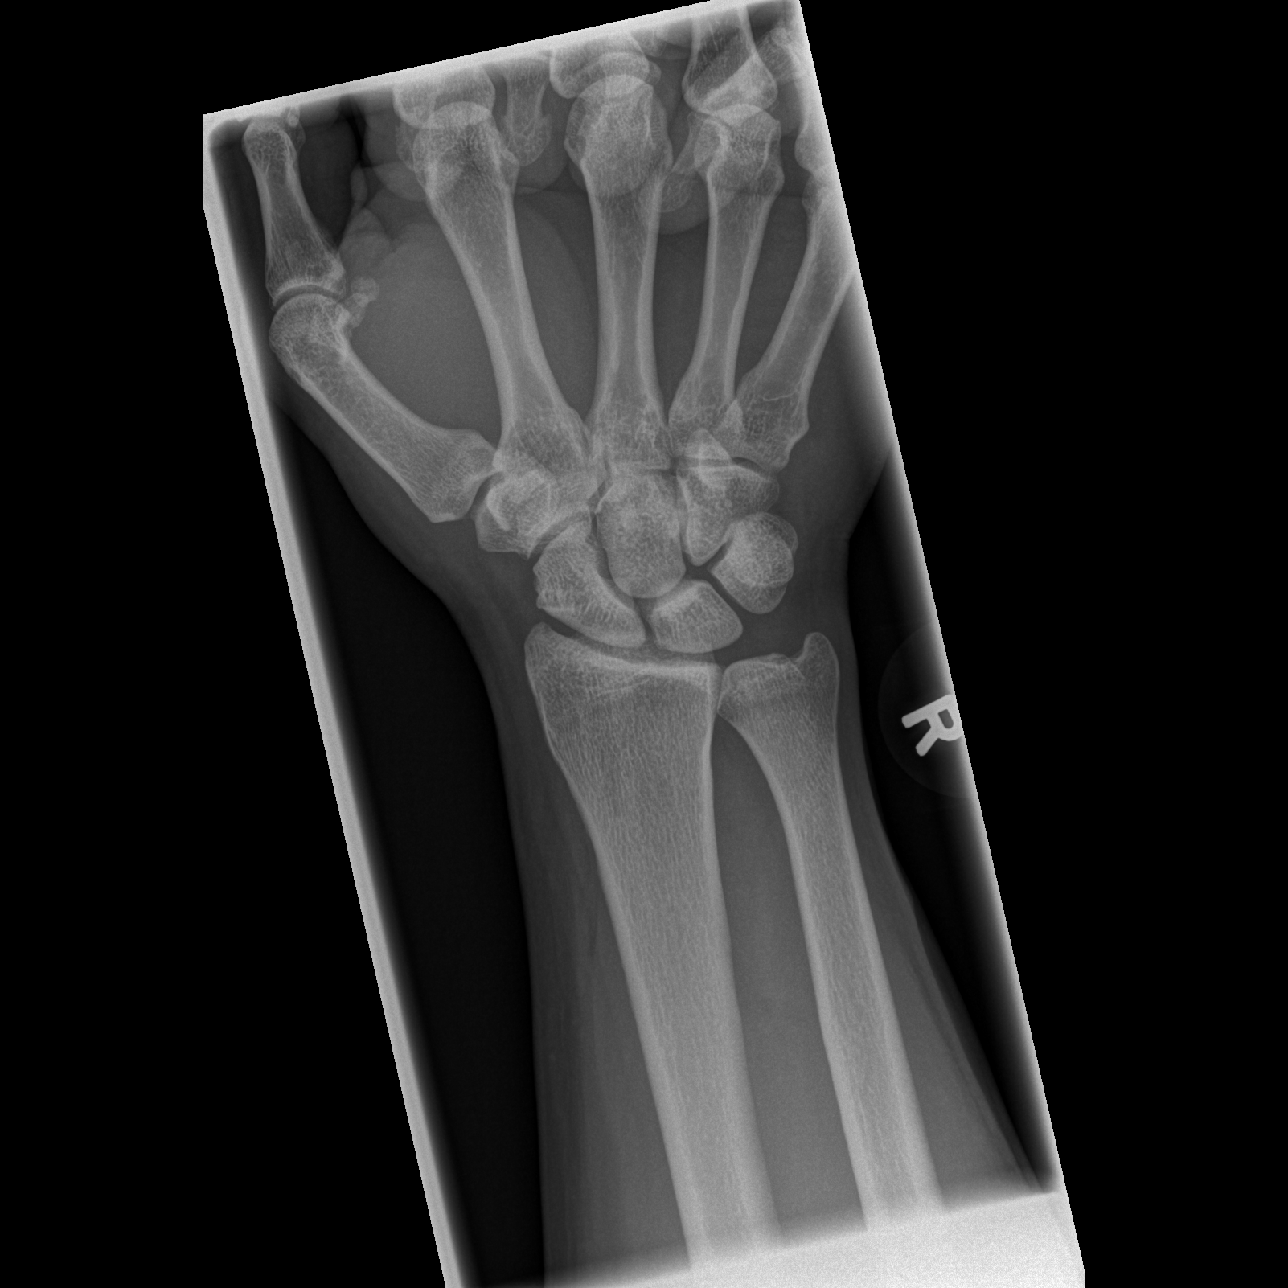

[x wrist obl right]
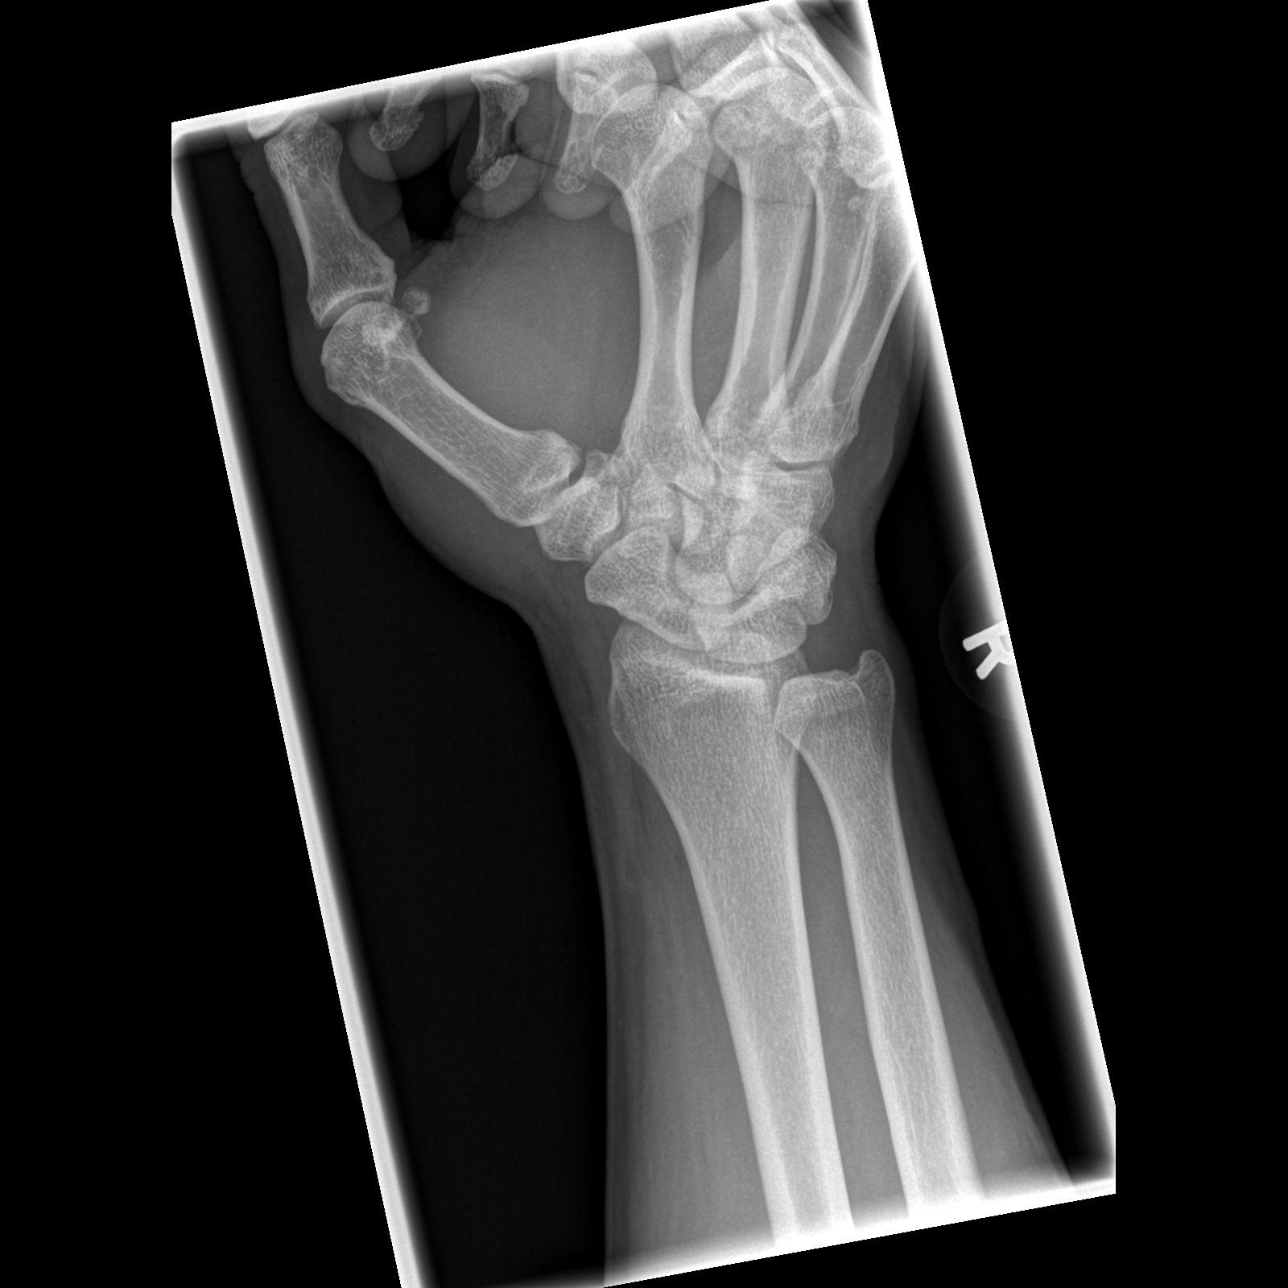

[x wrist lat right]
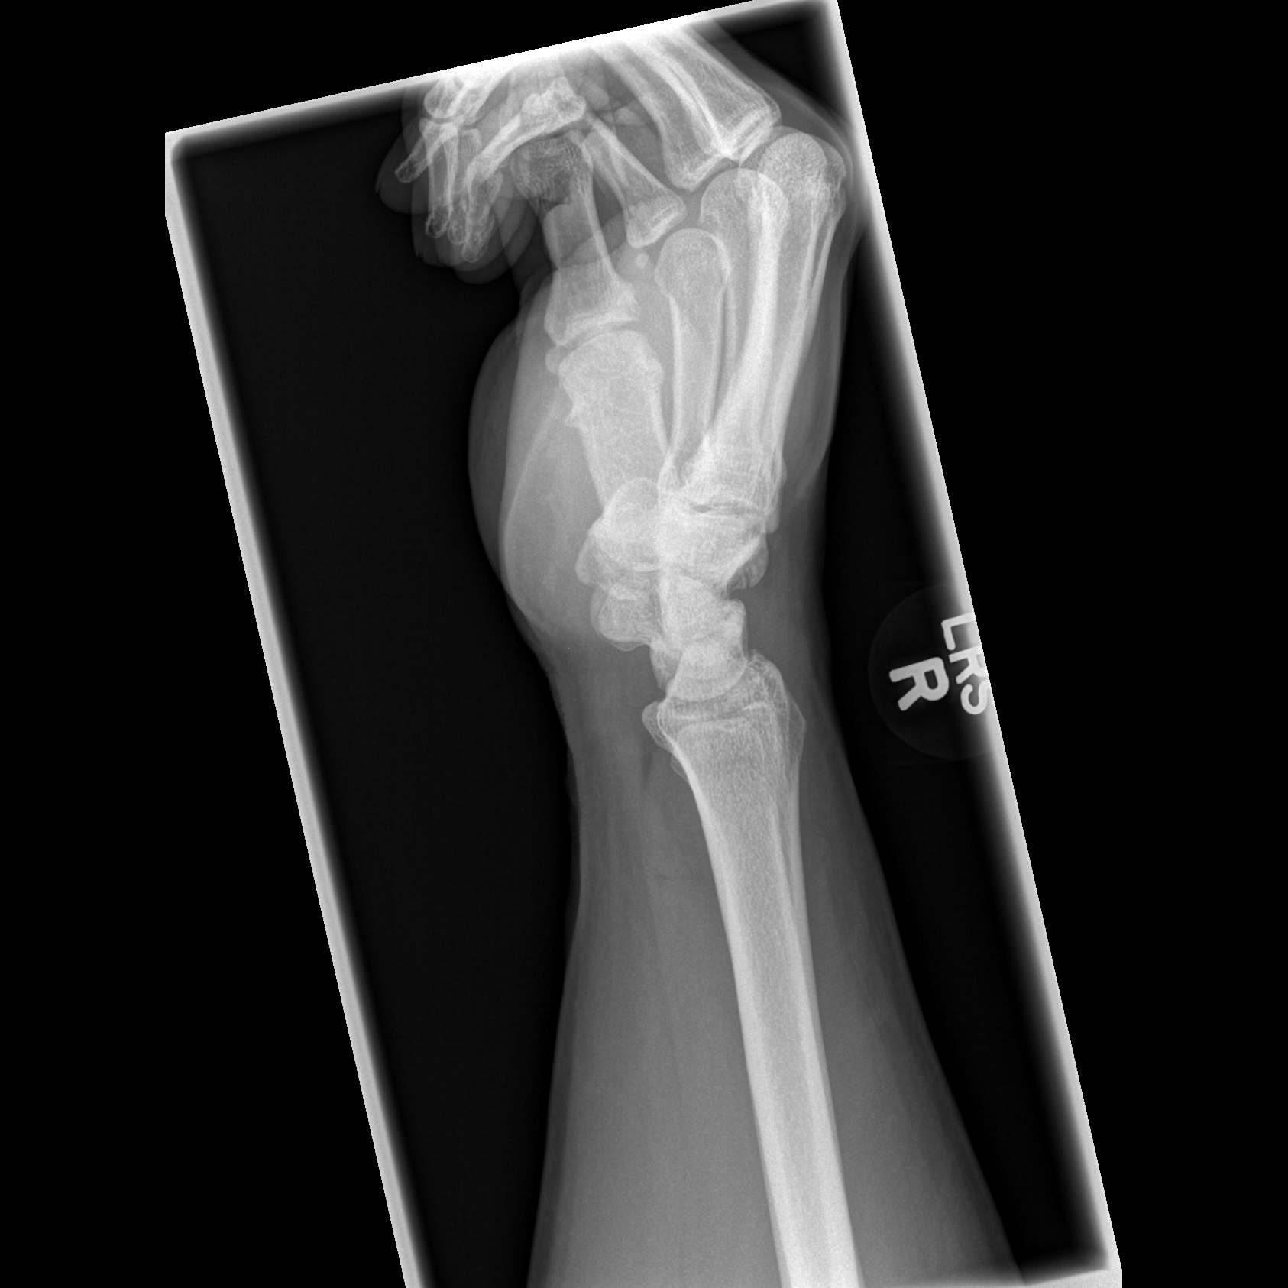

[x navicular]
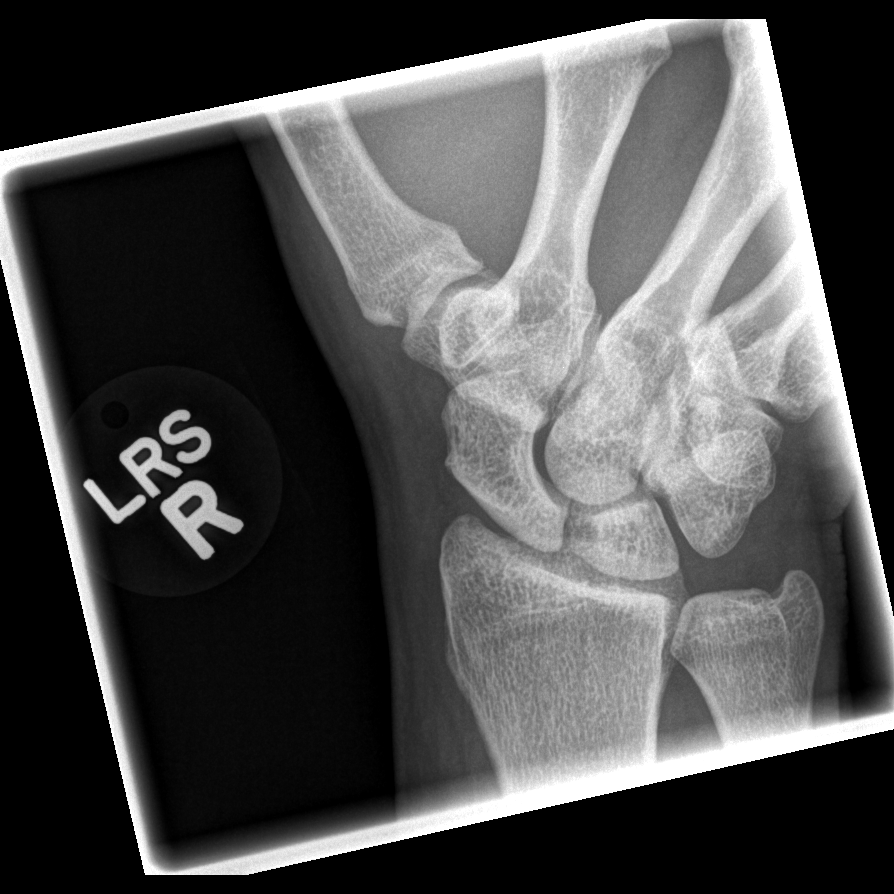

[4 of 4 positions shown; findings below may reference images not displayed]

FINDINGS: There is no evidence of fracture or dislocation. There is no
evidence of arthropathy or other focal bone abnormality. Soft
tissues are unremarkable.
IMPRESSION: Negative.
